# Patient Record
Sex: Male | Born: 1990
Health system: Southern US, Community
[De-identification: ages and names within clinical notes are randomized; demographics above are authoritative.]

---

## 2019-07-01 ENCOUNTER — Ambulatory Visit (HOSPITAL_COMMUNITY)
Admission: EM | Admit: 2019-07-01 | Discharge: 2019-07-01 | Disposition: A | Discharge status: Home / Self Care | Payer: Self-pay | Attending: Emergency Medicine | Admitting: Emergency Medicine

## 2019-07-01 ENCOUNTER — Other Ambulatory Visit: Payer: Self-pay

## 2019-07-01 ENCOUNTER — Encounter (HOSPITAL_COMMUNITY): Payer: Self-pay

## 2019-07-01 DIAGNOSIS — K648 Other hemorrhoids: Secondary | ICD-10-CM

## 2019-07-01 DIAGNOSIS — K644 Residual hemorrhoidal skin tags: Secondary | ICD-10-CM

## 2019-07-01 MED ORDER — LIDOCAINE-HYDROCORTISONE ACE 1-1 % EX CREA: 1.0000 "application " | TOPICAL_CREAM | Freq: Two times a day (BID) | CUTANEOUS | 0 refills | Status: DC

## 2019-07-01 NOTE — ED Provider Notes (Signed)
HPI  SUBJECTIVE:  Richard Hurst is a 28 y.o. male who presents with a rectal mass present for the past 10 days.  He reports pinching, stabbing intermittent pain that lasts up to a day or 2, and then gets better.  He states that he is currently not having any pain unless he touches it.  He comes in today because he saw some blood on his stool.  Denies melena, hematochezia.  No change in bowel habits, vomiting, fevers, body aches, abdominal pain, unintentional weight loss.  He states that he has been having issues with constipation recently.  He reports pain with stooling.  He has not tried anything for his symptoms.  No alleviating factors.  Symptoms are worse with sitting, standing, stooling, paplation.  Past medical history negative for cancer, anemia, hemorrhoids, anticoagulant, antiplatelet use, HIV.  Family history negative for colorectal cancer, IBS.  PMD: None.  All history obtained through language line.  History reviewed. No pertinent past medical history.  History reviewed. No pertinent surgical history.  History reviewed. No pertinent family history.  Social History   Tobacco Use  . Smoking status: Not on file  Substance Use Topics  . Alcohol use: Not on file  . Drug use: Not on file    No current facility-administered medications for this encounter.   Current Outpatient Medications:  .  Lidocaine-Hydrocortisone Ace 1-1 % CREA, Apply 1 application topically 2 (two) times daily., Disp: 30 g, Rfl: 0  No Known Allergies   ROS  As noted in HPI.   Physical Exam  BP 109/75 (BP Location: Right Arm)   Pulse 68   Temp 97.6 F (36.4 C) (Oral)   Resp 18   Wt 92 kg   SpO2 100%   Constitutional: Well developed, well nourished, no acute distress Eyes:  EOMI, conjunctiva normal bilaterally HENT: Normocephalic, atraumatic,mucus membranes moist Respiratory: Normal inspiratory effort Cardiovascular: Normal rate GI: nondistended Rectal: Large skin tag/healing external  hemorrhoid on the right side.  No tenderness, expressible purulent drainage, expressible clot, active bleeding.  Positive mild bleeding from internal hemorrhoids seen on anoscopy.  No prolapse of internal hemorrhoids. skin: No rash, skin intact Musculoskeletal: no deformities Neurologic: Alert & oriented x 3, no focal neuro deficits Psychiatric: Speech and behavior appropriate   ED Course   Medications - No data to display  No orders of the defined types were placed in this encounter.   No results found for this or any previous visit (from the past 24 hour(s)). No results found.  ED Clinical Impression  1. External hemorrhoid   2. Internal hemorrhoids      ED Assessment/Plan  Patient with a healing thrombosed external hemorrhoid/skin tag on the right side.  It does not appear to be a prolapsed internal hemorrhoid.  There is no evidence of infection, active bleeding.  He does have a small amount of bleeding of internal hemorrhoids.  Will send home with hydrocortisone/lidocaine cream  if symptoms become severe, do not use longer than a week, otherwise Preparation H, sitz bath, stool softeners.  Primary care list for routine care.  Follow-up with [place] [facility] for the internal hemorrhoids if they are still giving him problems in 6 to 8 weeks.  Discussed ER return precautions.  Using language line discussed  MDM, treatment plan, and plan for follow-up with patient. Discussed sn/sx that should prompt return to the ED. patient agrees with plan.   Meds ordered this encounter  Medications  . Lidocaine-Hydrocortisone Ace 1-1 % CREA  Sig: Apply 1 application topically 2 (two) times daily.    Dispense:  30 g    Refill:  0    *This clinic note was created using Scientist, clinical (histocompatibility and immunogenetics). Therefore, there may be occasional mistakes despite careful proofreading.   ?    Domenick Gong, MD 07/01/19 [phone]

## 2019-07-01 NOTE — Discharge Instructions (Addendum)
You can apply Preparation H.  You can try the lidocaine hydrocortisone if the symptoms become severe.  Do not apply the lidocaine/hydrocortisone cream for more than 1 week.  Sitz baths.  MiraLAX to help keep your stool soft.  Do not sit or strain on the toilet for prolonged periods of time.  Follow-up with a primary care provider as soon as you can for ongoing care, see list below.  Follow-up with [place] [facility] if you are still having symptoms in 6 to 8 weeks.  Below is a list of primary care practices who are taking new patients for you to follow-up with.  [facility] at [facility] [address] [place], [place] [address] [phone]  [place], [place] [address] [phone]  [facility] [phone] [place] [address]  [facility]: [place]  [phone]  [place] [facility]: [place]   [phone]  [facility]: [address] [place]  [phone]  [place] [facility] : [address] [place] [phone]  [facility]: [address] [place] [phone] [phone]  [facility]: [place] [phone]  Dr. Blanchie Serve [place]  [phone]  Dr. Benito Mccreedy, [facility]. [place], [place] [address]  [phone]  Go to www.goodrx.com to look up your medications. This will give you a list of where you can find your prescriptions at the most affordable prices. Or ask the pharmacist what the cash price is, or if they have  any other discount programs available to help make your medication more affordable. This can be less expensive than what you would pay with insurance.

## 2019-07-01 NOTE — ED Triage Notes (Signed)
Pt states he has rectal pain x 10 days. Pt states he has tried nothing for the pain.

## 2019-09-16 ENCOUNTER — Ambulatory Visit (INDEPENDENT_AMBULATORY_CARE_PROVIDER_SITE_OTHER): Payer: PRIVATE HEALTH INSURANCE

## 2019-09-16 ENCOUNTER — Encounter (HOSPITAL_COMMUNITY): Payer: Self-pay

## 2019-09-16 ENCOUNTER — Ambulatory Visit (HOSPITAL_COMMUNITY)
Admission: EM | Admit: 2019-09-16 | Discharge: 2019-09-16 | Disposition: A | Discharge status: Home / Self Care | Payer: PRIVATE HEALTH INSURANCE | Attending: Family Medicine | Admitting: Family Medicine

## 2019-09-16 ENCOUNTER — Other Ambulatory Visit: Payer: Self-pay

## 2019-09-16 DIAGNOSIS — M542 Cervicalgia: Secondary | ICD-10-CM | POA: Diagnosis not present

## 2019-09-16 DIAGNOSIS — S46812A Strain of other muscles, fascia and tendons at shoulder and upper arm level, left arm, initial encounter: Secondary | ICD-10-CM

## 2019-09-16 DIAGNOSIS — M25512 Pain in left shoulder: Secondary | ICD-10-CM

## 2019-09-16 DIAGNOSIS — M79602 Pain in left arm: Secondary | ICD-10-CM

## 2019-09-16 MED ORDER — KETOROLAC TROMETHAMINE 60 MG/2ML IM SOLN
60.0000 mg | Freq: Once | INTRAMUSCULAR | Status: AC
  Administered 2019-09-16: 11:00:00 60 mg via INTRAMUSCULAR

## 2019-09-16 MED ORDER — CYCLOBENZAPRINE HCL 10 MG PO TABS: 10.0000 mg | ORAL_TABLET | Freq: Two times a day (BID) | ORAL | 0 refills | Status: DC | PRN

## 2019-09-16 MED ORDER — KETOROLAC TROMETHAMINE 60 MG/2ML IM SOLN
INTRAMUSCULAR | Status: AC
  Filled 2019-09-16: qty 2

## 2019-09-16 MED ORDER — IBUPROFEN 800 MG PO TABS: 800.0000 mg | ORAL_TABLET | Freq: Three times a day (TID) | ORAL | 0 refills | Status: DC | PRN

## 2019-09-16 MED FILL — CYCLOBENZAPRINE 10 MG TAB: 10 | 10 days supply | Qty: 20 | Fill #0

## 2019-09-16 MED FILL — IBUPROFEN 800 MG TABLET: 800 | 7 days supply | Qty: 21 | Fill #0

## 2019-09-16 NOTE — Discharge Instructions (Addendum)
You likely have a strain in the muscles of your neck. This could be causing radiating pain down your arm.  Your xray today is negative for any bony abnormalities.   Follow up with orthopedics if symptoms persist. Go to the Emergency Room for sudden loss of sensation, unrelenting pain, or other concerning symptoms.

## 2019-09-16 NOTE — ED Provider Notes (Signed)
[facility]   [id number] 09/16/19 Arrival Time: 0943  CN:OBSJG PAIN  SUBJECTIVE: History from: patient. Richard Hurst is a 29 y.o. male complains of right shoulder and neck pain that began 2 weeks ago.  Denies a precipitating event or specific injury.  Localizes the pain to the R neck and shoulder. Describes the pain as intermittent and achy in character. Has tried OTC medications without relief.  Symptoms are made worse with activity. Denies similar symptoms in the past. Denies fever, chills, erythema, ecchymosis, effusion, weakness, numbness and tingling, saddle paresthesias, loss of bowel or bladder function.      ROS: As per HPI.  All other pertinent ROS negative.     History reviewed. No pertinent past medical history. History reviewed. No pertinent surgical history. No Known Allergies No current facility-administered medications on file prior to encounter.   Current Outpatient Medications on File Prior to Encounter  Medication Sig Dispense Refill  . Lidocaine-Hydrocortisone Ace 1-1 % CREA Apply 1 application topically 2 (two) times daily. 30 g 0   Social History   Socioeconomic History  . Marital status: Single    Spouse name: Not on file  . Number of children: Not on file  . Years of education: Not on file  . Highest education level: Not on file  Occupational History  . Not on file  Tobacco Use  . Smoking status: Never Smoker  Substance and Sexual Activity  . Alcohol use: Not on file  . Drug use: Not on file  . Sexual activity: Not on file  Other Topics Concern  . Not on file  Social History Narrative  . Not on file   Social Determinants of Health   Financial Resource Strain:   . Difficulty of Paying Living Expenses: Not on file  Food Insecurity:   . Worried About Charity fundraiser in the Last Year: Not on file  . Ran Out of Food in the Last Year: Not on file  Transportation Needs:   . Lack of Transportation (Medical): Not on file  . Lack of  Transportation (Non-Medical): Not on file  Physical Activity:   . Days of Exercise per Week: Not on file  . Minutes of Exercise per Session: Not on file  Stress:   . Feeling of Stress : Not on file  Social Connections:   . Frequency of Communication with Friends and Family: Not on file  . Frequency of Social Gatherings with Friends and Family: Not on file  . Attends Religious Services: Not on file  . Active Member of Clubs or Organizations: Not on file  . Attends Archivist Meetings: Not on file  . Marital Status: Not on file  Intimate Partner Violence:   . Fear of Current or Ex-Partner: Not on file  . Emotionally Abused: Not on file  . Physically Abused: Not on file  . Sexually Abused: Not on file   Family History  Family history unknown: Yes    OBJECTIVE:  Vitals:   09/16/19 1006  BP: 112/73  Pulse: 84  Resp: 18  Temp: 98.6 F (37 C)  TempSrc: Oral  SpO2: 98%    General appearance: ALERT; in no acute distress.  Head: NCAT Lungs: Normal respiratory effort CV: radial pulses 2+ bilaterally. Cap refill < 2 seconds Musculoskeletal:  Inspection: Skin warm, dry, clear and intact without obvious erythema, effusion, or ecchymosis.  Palpation: L Trapezius tenderness to palpation ROM: FROM active and passive Strength: 5/5 shld abduction, 5/5 shld adduction, 5/5  elbow flexion, 5/5 elbow extension, 5/5 grip strength, 5/5 hip flexion, 5/5 knee abduction, 5/5 knee adduction, 5/5 knee flexion, 5/5 knee extension, 5/5 dorsiflexion, 5/5 plantar flexion Stability: Anterior/ posterior drawer intact Skin: warm and dry Neurologic: Ambulates without difficulty; Sensation intact about the upper/ lower extremities Psychological: alert and cooperative; normal mood and affect  DIAGNOSTIC STUDIES:  DG Shoulder Left  Result Date: 09/16/2019 CLINICAL DATA:  Left shoulder for 2 weeks, no known injury, initial encounter EXAM: LEFT SHOULDER - 2+ VIEW COMPARISON:  None. FINDINGS: There  is no evidence of fracture or dislocation. There is no evidence of arthropathy or other focal bone abnormality. Soft tissues are unremarkable. IMPRESSION: No acute abnormality noted. Electronically Signed   By: Alcide Clever M.D.   On: 09/16/2019 10:46     ASSESSMENT & PLAN:  1. Trapezius strain, left, initial encounter   2. Neck pain   3. Left arm pain   4. Acute pain of left shoulder      Meds ordered this encounter  Medications  . ibuprofen (ADVIL) 800 MG tablet    Sig: Take 1 tablet (800 mg total) by mouth every 8 (eight) hours as needed for moderate pain.    Dispense:  21 tablet    Refill:  0    Order Specific Question:   Supervising Provider    Answer:   Merrilee Jansky [id number]  . cyclobenzaprine (FLEXERIL) 10 MG tablet    Sig: Take 1 tablet (10 mg total) by mouth 2 (two) times daily as needed for muscle spasms.    Dispense:  20 tablet    Refill:  0    Order Specific Question:   Supervising Provider    Answer:   Merrilee Jansky [id number]  . ketorolac (TORADOL) injection 60 mg    Continue conservative management of rest, ice, and gentle stretches Take ibuprofen as needed for pain relief (may cause abdominal discomfort, ulcers, and GI bleeds avoid taking with other NSAIDs) Take cyclobenzaprine at nighttime for symptomatic relief. Avoid driving or operating heavy machinery while using medication. Follow up with PCP if symptoms persist Return or go to the ER if you have any new or worsening symptoms (fever, chills, chest pain, abdominal pain, changes in bowel or bladder habits, pain radiating into lower legs, etc...)   [place] Controlled Substances Registry consulted for this patient. I feel the risk/benefit ratio today is favorable for proceeding with this prescription for a controlled substance. Medication sedation precautions given.  Reviewed expectations re: course of current medical issues. Questions answered. Outlined signs and symptoms indicating need for more acute  intervention. Patient verbalized understanding. After Visit Summary given.       Moshe Cipro, NP 09/16/19 1054

## 2019-09-16 NOTE — ED Triage Notes (Signed)
Pt presents with left side neck pain that radiates down into left arm X 2 weeks.

## 2019-11-02 ENCOUNTER — Ambulatory Visit (HOSPITAL_COMMUNITY)
Admission: EM | Admit: 2019-11-02 | Discharge: 2019-11-02 | Disposition: A | Discharge status: Home / Self Care | Payer: PRIVATE HEALTH INSURANCE | Attending: Physician Assistant | Admitting: Physician Assistant

## 2019-11-02 ENCOUNTER — Encounter (HOSPITAL_COMMUNITY): Payer: Self-pay

## 2019-11-02 ENCOUNTER — Other Ambulatory Visit: Payer: Self-pay

## 2019-11-02 DIAGNOSIS — M25512 Pain in left shoulder: Secondary | ICD-10-CM | POA: Diagnosis not present

## 2019-11-02 MED ORDER — IBUPROFEN 800 MG PO TABS: 800.0000 mg | ORAL_TABLET | Freq: Three times a day (TID) | ORAL | 0 refills | Status: DC | PRN

## 2019-11-02 MED ORDER — KETOROLAC TROMETHAMINE 60 MG/2ML IM SOLN
60.0000 mg | Freq: Once | INTRAMUSCULAR | Status: AC
  Administered 2019-11-02: 60 mg via INTRAMUSCULAR

## 2019-11-02 MED ORDER — KETOROLAC TROMETHAMINE 60 MG/2ML IM SOLN
INTRAMUSCULAR | Status: AC
  Filled 2019-11-02: qty 2

## 2019-11-02 MED FILL — IBUPROFEN 800 MG TABLET: 800 | 7 days supply | Qty: 21 | Fill #0

## 2019-11-02 NOTE — Discharge Instructions (Signed)
Take the ibuprofen every 8 hours. Wear the sling for the next 2 to 3 days. Call the sports medicine group to have evaluation in the next 1 to 2 weeks.  I have given you a note to have duty such that you do not lift anything over 20 pounds over your head for 1 week.  If you are unable to be seen by sports medicine and continue to have severe pain despite treatment in 3 to 4 days please return for reevaluation.

## 2019-11-02 NOTE — ED Provider Notes (Signed)
[facility]    CSN: [id number] Arrival date & time: 11/02/19  1053      History   Chief Complaint Chief Complaint  Patient presents with  . Shoulder Pain    HPI Richard Hurst is a 29 y.o. male.   Patient reports urgent care for evaluation of left shoulder pain.  He reports his left shoulder has been giving him issues over the last 1 week.  Reports pain in the shoulder joint when the arm is hanging down.  He reports pain also with movement.  He reports the pain is sharp at times and achy at others.  Pain is located generally all over the shoulder patient does point to the front side and back of the shoulder.  He reports a history in February of having similar shoulder and trap and neck pain.  However this is different because this is primarily in the shoulder.  He denies injuring the shoulder directly.  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  He denies any numbness, tingling or weakness in the upper extremities.  Denies history of shoulder injury or surgeries.  Patient was seen in this urgent care on 09/16/2019 diagnosed with trapezius strain placed on ibuprofen and Flexeril.  Reports that the trapezius area pain is resolved however the shoulder pain is persisted.  Patient is requesting to be written out of work for 2 weeks.     History reviewed. No pertinent past medical history.  There are no problems to display for this patient.   History reviewed. No pertinent surgical history.     Home Medications    Prior to Admission medications   Medication Sig Start Date End Date Taking? Authorizing Provider  cyclobenzaprine (FLEXERIL) 10 MG tablet Take 1 tablet (10 mg total) by mouth 2 (two) times daily as needed for muscle spasms. 09/16/19   Moshe Cipro, NP  ibuprofen (ADVIL) 800 MG tablet Take 1 tablet (800 mg total) by mouth every 8 (eight) hours as needed for moderate pain. 11/02/19   Guillermo Difrancesco, Veryl Speak, PA-C  Lidocaine-Hydrocortisone Ace 1-1 %  CREA Apply 1 application topically 2 (two) times daily. 07/01/19   Domenick Gong, MD    Family History Family History  Family history unknown: Yes    Social History Social History   Tobacco Use  . Smoking status: Never Smoker  . Smokeless tobacco: Never Used  Substance Use Topics  . Alcohol use: Not on file  . Drug use: Not on file     Allergies   Patient has no known allergies.   Review of Systems Review of Systems  Constitutional: Negative for chills and fever.  Musculoskeletal:       See HPI  Skin: Negative for color change and rash.  Neurological: Negative for headaches.     Physical Exam Triage Vital Signs ED Triage Vitals  Enc Vitals Group     BP 11/02/19 1147 (!) 131/99     Pulse Rate 11/02/19 1147 86     Resp 11/02/19 1147 18     Temp 11/02/19 1147 98.2 F (36.8 C)     Temp Source 11/02/19 1147 Oral     SpO2 11/02/19 1147 100 %     Weight --      Height --      Head Circumference --      Peak Flow --      Pain Score 11/02/19 1148 10     Pain Loc --  Pain Edu? --      Excl. in GC? --    No data found.  Updated Vital Signs BP (!) 131/99 (BP Location: Right Arm)   Pulse 86   Temp 98.2 F (36.8 C) (Oral)   Resp 18   SpO2 100%   Visual Acuity Right Eye Distance:   Left Eye Distance:   Bilateral Distance:    Right Eye Near:   Left Eye Near:    Bilateral Near:     Physical Exam Vitals and nursing note reviewed.  Constitutional:      Appearance: He is well-developed.     Comments: Patient holds shoulder frequently in pain while at rest with increased animation of distress.  However this is inconsistent with amount of pain elicited through range of motion exam and palpation.  HENT:     Head: Normocephalic and atraumatic.  Eyes:     Conjunctiva/sclera: Conjunctivae normal.  Cardiovascular:     Rate and Rhythm: Normal rate and regular rhythm.     Heart sounds: No murmur.  Pulmonary:     Effort: Pulmonary effort is normal. No  respiratory distress.     Breath sounds: Normal breath sounds.  Abdominal:     Palpations: Abdomen is soft.     Tenderness: There is no abdominal tenderness.  Musculoskeletal:     Cervical back: Neck supple. No rigidity or tenderness.     Comments: No shoulder deformity ecchymosis or swelling.   Patient has full range of motion at the shoulder able to fully circumduct and raise overhead.  There is some reported pain with this range of motion however it is not limited his range of motion.  There is some tenderness to palpation of the posterior aspect of the shoulder.  Some pain elicited with empty can testing.  Pain elicited with internal and external rotation to resistance.  AC joint without step-off and no sag on distal traction of the shoulder.  Strength 5/5 and equal with resisted abduction and adduction of the shoulder, 5/5 strength at the elbow joint in all directions and grip strength.  Skin:    General: Skin is warm and dry.     Capillary Refill: Capillary refill takes less than 2 seconds.  Neurological:     General: No focal deficit present.     Mental Status: He is alert and oriented to person, place, and time.     Comments: Sensation equal bilaterally in upper extremities      UC Treatments / Results  Labs (all labs ordered are listed, but only abnormal results are displayed) Labs Reviewed - No data to display  EKG   Radiology No results found.  Procedures Procedures (including critical care time)  Medications Ordered in UC Medications  ketorolac (TORADOL) injection 60 mg (60 mg Intramuscular Given 11/02/19 1242)    Initial Impression / Assessment and Plan / UC Course  I have reviewed the triage vital signs and the nursing notes.  Pertinent labs & imaging results that were available during my care of the patient were reviewed by me and considered in my medical decision making (see chart for details).     #Shoulder pain Patient is a 29 year old presenting with  acute left shoulder pain.  Appears that his trapezius muscle strain is resolved however he has pain specific to the shoulder joint this time.  Differential would include rotator cuff tendinopathy vs biceps tendinitis vs labral injury vs  deltoid strain.  Given patient has increased pain with arm hanging will  place in a sling for comfort.  Discussed Toradol injection patient agrees to this.  Discussed the patient should follow-up with sports medicine as he has continued shoulder pain.  Discussed that I cannot write the patient out for 2 weeks as we have a policy of only giving 3 days.  Will give 3-day work note and instructed follow-up with sports medicine.  Patient verbalizes he will do this.  Final Clinical Impressions(s) / UC Diagnoses   Final diagnoses:  Acute pain of left shoulder     Discharge Instructions     Take the ibuprofen every 8 hours. Wear the sling for the next 2 to 3 days. Call the sports medicine group to have evaluation in the next 1 to 2 weeks.  I have given you a note to have duty such that you do not lift anything over 20 pounds over your head for 1 week.  If you are unable to be seen by sports medicine and continue to have severe pain despite treatment in 3 to 4 days please return for reevaluation.      ED Prescriptions    Medication Sig Dispense Auth. Provider   ibuprofen (ADVIL) 800 MG tablet Take 1 tablet (800 mg total) by mouth every 8 (eight) hours as needed for moderate pain. 21 tablet Jeanett Antonopoulos, Marguerita Beards, PA-C     PDMP not reviewed this encounter.   Purnell Shoemaker, PA-C 11/03/19 [phone]

## 2019-11-02 NOTE — ED Triage Notes (Signed)
Pt states he has left shoulder pain. X 1 week pt states that when he holds his arm down the pain is a 10.

## 2019-11-05 ENCOUNTER — Other Ambulatory Visit: Payer: Self-pay

## 2019-11-05 ENCOUNTER — Ambulatory Visit (INDEPENDENT_AMBULATORY_CARE_PROVIDER_SITE_OTHER): Payer: PRIVATE HEALTH INSURANCE | Admitting: Pediatrics

## 2019-11-05 VITALS — BP 108/72 | Ht 75.0 in

## 2019-11-05 DIAGNOSIS — M542 Cervicalgia: Secondary | ICD-10-CM

## 2019-11-05 MED ORDER — PREDNISONE 10 MG (21) PO TBPK: ORAL_TABLET | Freq: Every day | ORAL | 0 refills | Status: AC

## 2019-11-05 MED ORDER — CYCLOBENZAPRINE HCL 10 MG PO TABS: 10.0000 mg | ORAL_TABLET | Freq: Two times a day (BID) | ORAL | 0 refills | Status: AC | PRN

## 2019-11-05 MED ORDER — PREDNISONE 10 MG PO TABS: ORAL_TABLET | ORAL | 0 refills | Status: DC

## 2019-11-05 MED FILL — predniSONE 10 MG TABS: 10 | 7 days supply | Qty: 21 | Fill #0

## 2019-11-05 MED FILL — CYCLOBENZAPRINE 10 MG TAB: 10 | 20 days supply | Qty: 40 | Fill #0

## 2019-11-05 NOTE — Progress Notes (Signed)
Richard Hurst - 29 y.o. male MRN [id number]  Date of birth: May 17, 1991  SUBJECTIVE:   CC: Left shoulder pain   29 year old gentleman presenting for left shoulder pain for the past 2 weeks.  He reports that he woke up with shoulder pain. He has pain throughout shoulder as well as some pain extending into his trapezius area. Pain is better when lying down and worse when sitting or standing. He also reports numbness and tingling down his arm extending into his hand. He denies any specific injury or inciting event.  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  He presented to the ED 3 days ago and was provided a Toradol injection, a prescription for Flexeril, and lidocaine ointment.  They also gave him a sling for his arm as he had more comfort with arm in sling than when hanging by his side.  He reports that the Flexeril helps some.   No weakness. Has not changed over the course of 2 weeks. He reports that he had similar pain 2 months ago in his neck and left shoulder.  ROS:  see HPI   PMHx - Updated and reviewed.  Contributory factors include: Negative PSHx - Updated and reviewed.  Contributory factors include:  Negative FHx - Updated and reviewed.  Contributory factors include:  Negative Social Hx - Updated and reviewed. Contributory factors include: Negative Medications - reviewed   DATA REVIEWED: ED records   PHYSICAL EXAM:  VS: BP:108/72  HR: bpm  TEMP: ( )  RESP:   HT:6\' 3"  (190.5 cm)   WT:   BMI:  PHYSICAL EXAM: Gen: NAD, alert, appears in pain HEENT: clear conjunctiva,  CV:  no edema, capillary refill brisk, normal rate Resp: non-labored Skin: no rashes, normal turgor  Neuro: no gross deficits.  Psych:  alert and oriented  Left Shoulder: Inspection reveals no obvious deformity, atrophy, or asymmetry. No bruising. No swelling General pain throughout shoulder- does not change with palpation Full ROM in flexion, abduction,  internal/external rotation NV intact distally Normal scapular function observed. Special Tests:  - Impingement: Neg Hawkins, neers, empty can sign. - Supraspinatous: Negative empty can.  5/5 strength with resisted flexion at 20 degrees - Infraspinatous/Teres Minor: 5/5 strength with ER - Subscapularis: 5/5 strength with IR - Biceps tendon: Negative Speeds, Yerrgason's  - No painful arc   Neck/Back: - Inspection: no gross deformity or asymmetry, swelling or ecchymosis - Palpation: no TTP over spinous process, TTP with trigger point in left lateral trapezius muscle - ROM: full active ROM of the cervical spine with neck extension, rotation, flexion - pain in all directions - Strength: 5/5 wrist flexion, extension, biceps flexion. 5/5 triceps extension. OK sign, interosseus strength intact  - Neuro: sensation intact in the C5-C8 nerve root distribution b/l, 2+ C5-C7 reflexes - Special testing: positive spurling's    ASSESSMENT & PLAN:  29 year old male presenting with left shoulder pain as well as radicular symptoms for the past 2 weeks. Suspect that he has impingement of C5 nerve root from a possible disc protrusion that is causing pain in shoulder as well as radicular symptoms. Spurling's maneuver reproduced his symptoms, but no weakness noted on exam and normal reflexes.  Will treat with a steroid Dosepak to see if this relieves his symptoms as well as referral to physical therapy.  Also refilled Flexeril. Return in 6 weeks.  I was the preceptor for this visit and available for immediate consultation Marsa Aris,  DO

## 2019-11-12 ENCOUNTER — Encounter: Payer: Self-pay | Admitting: Physical Therapy

## 2019-11-12 ENCOUNTER — Other Ambulatory Visit: Payer: Self-pay

## 2019-11-12 ENCOUNTER — Ambulatory Visit: Payer: PRIVATE HEALTH INSURANCE | Attending: Sports Medicine | Admitting: Physical Therapy

## 2019-11-12 DIAGNOSIS — M25512 Pain in left shoulder: Secondary | ICD-10-CM | POA: Diagnosis present

## 2019-11-12 DIAGNOSIS — M62838 Other muscle spasm: Secondary | ICD-10-CM | POA: Insufficient documentation

## 2019-11-12 DIAGNOSIS — G8929 Other chronic pain: Secondary | ICD-10-CM | POA: Insufficient documentation

## 2019-11-12 NOTE — Therapy (Signed)
[facility] Outpatient Rehabilitation [facility] [address] [place], [place], [address] Phone: [phone]   Fax:  [phone]  Physical Therapy Evaluation  Patient Details  Name: Richard Hurst MRN: [id number] Date of Birth: [birth year] Referring Provider (PT): Dr Reino Bellis    Encounter Date: 11/12/2019  PT End of Session - 11/12/19 1727    Visit Number  1    Number of Visits  6    Date for PT Re-Evaluation  12/24/19    Authorization Type  [employer]    PT Start Time  1545    PT Stop Time  1626    PT Time Calculation (min)  41 min    Activity Tolerance  Patient tolerated treatment well    Behavior During Therapy  [facility] for tasks assessed/performed       History reviewed. No pertinent past medical history.  History reviewed. No pertinent surgical history.  There were no vitals filed for this visit.   Subjective Assessment - 11/12/19 1550    Subjective  Patient began having left shoulder pain about 3 weeks ago. He had no MOI. he lifts boxes at work but can not rememeber doing anything to the shoulder. He is currently in a sling. If he hangs his arm down the pain is worse. Whe pain is in the front of his shoulder and he has some tightness in his neck. he reports that pain can go down into his hand.    Patient is accompained by:  Interpreter   strtus Efrain Sella [id number]   Limitations  Lifting    Diagnostic tests  X-ray: (-) for acute abnormality    Currently in Pain?  Yes    Pain Score  7     Pain Location  Arm    Pain Orientation  Left    Pain Descriptors / Indicators  Aching    Pain Type  Chronic pain    Pain Radiating Towards  into his hand    Pain Onset  More than a month ago    Pain Frequency  Constant    Aggravating Factors   letting his arm hang down; moving the shoulder forward and back    Pain Relieving Factors  has a cream he puts on it    Effect of Pain on Daily Activities  difficulty using his arm         [facility] PT Assessment - 11/12/19 0001       Assessment   Medical Diagnosis  Left Shoulder Pain     Referring Provider (PT)  Dr Reino Bellis     Onset Date/Surgical Date  --   3 weeks prior    Hand Dominance  Right    Next MD Visit  Nothing scheduled at this time     Prior Therapy  None       Precautions   Precautions  None      Restrictions   Weight Bearing Restrictions  No      Balance Screen   Has the patient fallen in the past 6 months  No    Has the patient had a decrease in activity level because of a fear of falling?   No    Is the patient reluctant to leave their home because of a fear of falling?   No      Prior Function   Level of Independence  Independent    Vocation  Full time employment    Vocation Requirements  works moving boxes  Leisure  Patient plays soccer       Cognition   Overall Cognitive Status  Within Functional Limits for tasks assessed    Attention  Focused      Observation/Other Assessments   Observations  shows non-verbal signs of pain. The patient is costnatly shifting in his chair and rubbing his shoulder     Focus on Therapeutic Outcomes (FOTO)   not given       Sensation   Light Touch  Appears Intact      Coordination   Gross Motor Movements are Fluid and Coordinated  Yes    Fine Motor Movements are Fluid and Coordinated  Yes      ROM / Strength   AROM / PROM / Strength  AROM;PROM;Strength      AROM   Overall AROM Comments  full active flexion, internal rotation and extenral rotation but reports pain with each; full active cervical motion with pain in the shoulder with end range flexion       Strength   Overall Strength Comments  right shoulder 5/5     Strength Assessment Site  Shoulder;Hand    Right/Left Shoulder  Right;Left    Left Shoulder Flexion  4+/5    Left Shoulder Internal Rotation  4+/5    Left Shoulder External Rotation  4+/5    Right/Left hand  Right;Left    Right Hand Grip (lbs)  60    Left Hand Grip (lbs)  30      Palpation   Palpation comment   signifcant tenderness to palpation in the posterior shoulder and anterior shouder  Significant tightness in the neck but reports no tenderness to palpation                 Objective measurements completed on examination: See above findings.      [facility] Adult PT Treatment/Exercise - 11/12/19 0001      Shoulder Exercises: Supine   Other Supine Exercises  shoulder extension yellow 2x10; scap retraction yellow 2x10 ;       Manual Therapy   Manual therapy comments  attempted grade 1 ishoulder inferior glides and APglides but patient showed signs of increased pain; attempted upper trap trigger point release yto upper trap and light traction       Neck Exercises: Stretches   Upper Trapezius Stretch Limitations  2x20 sec hold     Levator Stretch Limitations  2x20 sec hold              PT Education - 11/12/19 1604    Education Details  improitance of posture and moving; symptom mangement    Person(s) Educated  Patient    Methods  Explanation;Demonstration;Verbal cues;Tactile cues;Handout    Comprehension  Verbalized understanding;Returned demonstration;Verbal cues required;Tactile cues required       PT Short Term Goals - 11/12/19 1659      PT SHORT TERM GOAL #1   Title  Patient will report no pain with end range cervical extension    Time  3    Period  Weeks    Status  New    Target Date  12/03/19      PT SHORT TERM GOAL #2   Title  Patient will report no radicular pain into right arm    Time  3    Period  Weeks    Status  New    Target Date  12/03/19      PT SHORT TERM GOAL #3   Title  Patient  will demonstrate 5/5 gross left shoulder strength    Time  3    Period  Weeks    Status  New    Target Date  12/03/19        PT Long Term Goals - 11/12/19 1705      PT LONG TERM GOAL #1   Title  Patient will keep arm out of sling for 1 day with no pain    Time  6    Period  Weeks    Status  New    Target Date  12/24/19      PT LONG TERM GOAL #2   Title   Patient will use arm for all adl's without increased pain    Time  6    Period  Weeks    Status  New    Target Date  12/24/19      PT LONG TERM GOAL #3   Title  Patient will perfrom work tasks without pain    Time  6    Period  Weeks    Status  New    Target Date  12/24/19             Plan - 11/12/19 1645    Clinical Impression Statement  Patient is a 29 year old male with right shoulder and neck pain. He shows severe non-verbal signs of pain during the treatment. Despite high levels of pain the patient had 4+/5 shoulder flexion and ER strength, full active shoulder motion, and full cervical motion in all palnes. He did report increased pain with cervical extension. He has decreased grip strength on the left compared to right. He reported no tenderness to palpation of his upper trap and cervical spine. He had high levels of pain in hs posterior and anterior shoulder with palpation. He did not respond well to light soft tissue mobilization or shoulder joint low grade mobilizations meant for pain releif. He has tightness in bilateral upper traps but he has tightness bilateral.  He was given light stretching for home and light posterior chain strengthening for posture and to decrease pressure on the anterior shoulder. We will see next visit if we can get him to respond better to interventions to decrease pain and spasming and otherwise try to progress postrural correction and self stretching exercises. Patient asked when he could go abck to work . he was advised that the MD will make that determination based on his progress.    Personal Factors and Comorbidities  Profession    Examination-Activity Limitations  Carry;Lift;Reach Overhead    Examination-Participation Restrictions  Community Activity;Driving    Stability/Clinical Decision Making  Evolving/Moderate complexity   increasing pain in left shoulder   Clinical Decision Making  High    Rehab Potential  Fair   high levels of pain with  minimal strength and motion restrictions   PT Frequency  1x / week    PT Duration  6 weeks    PT Treatment/Interventions  ADLs/Self Care Home Management;Electrical Stimulation;Cryotherapy;Iontophoresis 4mg /ml Dexamethasone;Ultrasound;DME Instruction;Functional mobility training;Therapeutic exercise;Neuromuscular re-education;Patient/family education;Manual techniques;Passive range of motion;Dry needling;Taping    PT Next Visit Plan  patient did not respond well to low grade shoulder mobilizations, STM to upper trap or light cervical traction, we can try again maybe without the examation protion he will have a better respose. He was able to tolerate light strethcing and posterior chain stretching. Consider modalities if beneficial. If patient improves we can schedule out more visits. Consdier wand ther-ex; consider closed chain  shoulder strengthening    PT Home Exercise Plan  upper trap stretch, levator stretch, scpa retraction yellow; shoulder extneison yellow    Consulted and Agree with Plan of Care  Patient       Patient will benefit from skilled therapeutic intervention in order to improve the following deficits and impairments:  Impaired UE functional use, Postural dysfunction, Pain, Decreased strength, Decreased mobility, Decreased activity tolerance, Decreased endurance, Increased muscle spasms  Visit Diagnosis: Chronic left shoulder pain  Other muscle spasm     Problem List There are no problems to display for this patient.   Dessie Coma PT DPT  11/12/2019, 5:28 PM  [facility] [address] [place], [place], [address] Phone: [phone]   Fax:  [phone]  Name: Eland Lamantia MRN: [id number] Date of Birth: Dec 16, 1990

## 2019-11-16 ENCOUNTER — Encounter: Payer: Self-pay | Admitting: Physical Therapy

## 2019-11-16 ENCOUNTER — Ambulatory Visit: Payer: PRIVATE HEALTH INSURANCE

## 2019-11-16 ENCOUNTER — Encounter: Payer: Self-pay | Admitting: *Deleted

## 2019-11-16 ENCOUNTER — Other Ambulatory Visit: Payer: Self-pay

## 2019-11-16 ENCOUNTER — Ambulatory Visit: Payer: PRIVATE HEALTH INSURANCE | Admitting: Physical Therapy

## 2019-11-16 DIAGNOSIS — M25512 Pain in left shoulder: Secondary | ICD-10-CM

## 2019-11-16 DIAGNOSIS — G8929 Other chronic pain: Secondary | ICD-10-CM

## 2019-11-16 DIAGNOSIS — M62838 Other muscle spasm: Secondary | ICD-10-CM

## 2019-11-16 NOTE — Patient Instructions (Signed)
Access Code: [facility] URL: [email or web address] Date: 11/16/2019 Prepared by: Rosana Hoes  Exercises Banded Row - 1 x daily - 7 x weekly - 2 sets - 20 reps Shoulder Extension with Band - 1 x daily - 7 x weekly - 2 sets - 20 reps Shoulder External Rotation with Anchored Resistance - 1 x daily - 7 x weekly - 2 sets - 20 reps Shoulder Internal Rotation with Resistance - 1 x daily - 7 x weekly - 2 sets - 20 reps Scaption with Dumbbells - 1 x daily - 7 x weekly - 2 sets - 20 reps Doorway Pec Stretch at 90 Degrees Abduction - 2 x daily - 7 x weekly - 3 reps - 20 seconds hold Seated Cervical Sidebending Stretch - 2 x daily - 7 x weekly - 2 reps - 20 seconds hold Seated Levator Scapulae Stretch - 2 x daily - 7 x weekly - 2 reps - 20 seconds hold

## 2019-11-16 NOTE — Therapy (Signed)
[facility] Outpatient Rehabilitation [facility] [address] [place], [place], [address] Phone: [phone]   Fax:  [phone]  Physical Therapy Treatment  Patient Details  Name: Richard Hurst MRN: [id number] Date of Birth: 02-09-1991 Referring Provider (PT): Dr Reino Bellis    Encounter Date: 11/16/2019  PT End of Session - 11/16/19 1626    Visit Number  2    Number of Visits  6    Date for PT Re-Evaluation  12/24/19    Authorization Type  [employer]    PT Start Time  1617    PT Stop Time  1700    PT Time Calculation (min)  43 min    Activity Tolerance  Patient limited by pain    Behavior During Therapy  [facility] for tasks assessed/performed       History reviewed. No pertinent past medical history.  History reviewed. No pertinent surgical history.  There were no vitals filed for this visit.  Subjective Assessment - 11/16/19 1624    Subjective  Patient reports he is feeling a litte bit better. He is doing the exercises. 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    Patient is accompained by:  Interpreter   audio   Patient Stated Goals  Get shoulder better    Currently in Pain?  Yes    Pain Score  5     Pain Location  Shoulder    Pain Orientation  Left    Pain Descriptors / Indicators  Aching    Pain Type  Chronic pain    Pain Onset  More than a month ago    Pain Frequency  Constant         [facility] PT Assessment - 11/16/19 0001      AROM   Overall AROM Comments  Full AROM without report of increased pain, he does exhibit hyperflexibility                   [facility] Adult PT Treatment/Exercise - 11/16/19 0001      Shoulder Exercises: Standing   External Rotation  20 reps   2 sets, towel under arm   Theraband Level (Shoulder External Rotation)  Level 4 (Blue)    Internal Rotation  20 reps   2 sets, towel under arm   Theraband Level (Shoulder Internal Rotation)  Level 4 (Blue)    Flexion  20 reps   2  sets, scaption to shoulder height   Shoulder Flexion Weight (lbs)  4    ABduction  20 reps   2 sets, palm down to shoulder height   Shoulder ABduction Weight (lbs)  4    Extension  20 reps   2 sets   Theraband Level (Shoulder Extension)  Level 4 (Blue)    Row  20 reps   2 sets   Theraband Level (Shoulder Row)  Level 4 (Blue)    Other Standing Exercises  Forward ball roll on table for o verhead stretch 5 sec hold x10      Shoulder Exercises: ROM/Strengthening   UBE (Upper Arm Bike)  L4 x 4 min ( 2 fwd/bwd)      Shoulder Exercises: Stretch   Other Shoulder Stretches  Doorway pec stretch with arms at 90 deg abd  3x20 seconds      Neck Exercises: Stretches   Upper Trapezius Stretch  2 reps;20 seconds    Levator Stretch  2 reps;20 seconds  PT Education - 11/16/19 1625    Education Details  HEP, postural control    Person(s) Educated  Patient    Methods  Explanation;Demonstration;Verbal cues;Handout;Tactile cues    Comprehension  Verbalized understanding;Returned demonstration;Verbal cues required;Tactile cues required;Need further instruction       PT Short Term Goals - 11/12/19 1659      PT SHORT TERM GOAL #1   Title  Patient will report no pain with end range cervical extension    Time  3    Period  Weeks    Status  New    Target Date  12/03/19      PT SHORT TERM GOAL #2   Title  Patient will report no radicular pain into right arm    Time  3    Period  Weeks    Status  New    Target Date  12/03/19      PT SHORT TERM GOAL #3   Title  Patient will demonstrate 5/5 gross left shoulder strength    Time  3    Period  Weeks    Status  New    Target Date  12/03/19        PT Long Term Goals - 11/12/19 1705      PT LONG TERM GOAL #1   Title  Patient will keep arm out of sling for 1 day with no pain    Time  6    Period  Weeks    Status  New    Target Date  12/24/19      PT LONG TERM GOAL #2   Title  Patient will use arm for all adl's without  increased pain    Time  6    Period  Weeks    Status  New    Target Date  12/24/19      PT LONG TERM GOAL #3   Title  Patient will perfrom work tasks without pain    Time  6    Period  Weeks    Status  New    Target Date  12/24/19            Plan - 11/16/19 1626    Clinical Impression Statement  Patient tolerated therapy well with no adverse effects. His strengthening exercises were progressed this visit without any report of increased pain and no non-verbal signs that he is experiencing pain with exercise. Manual therapy was deferred as patient demonstrated high levels of discomfort with palpation during evaluation and he exhibits good range of motion. His HEP was progressed this visit and he was encouraged to work on posture. Patient would benefit from continued skilled PT to progress strength and activity tolerance so he can work with reduced pain and limitation.    PT Treatment/Interventions  ADLs/Self Care Home Management;Electrical Stimulation;Cryotherapy;Iontophoresis 4mg /ml Dexamethasone;Ultrasound;DME Instruction;Functional mobility training;Therapeutic exercise;Neuromuscular re-education;Patient/family education;Manual techniques;Passive range of motion;Dry needling;Taping    PT Next Visit Plan  Assess response to HEP and progress PRN, progress periscapular and rotator cuff strength/endurance, consider including rhythmic stabilization as patient exhibits hypermobile tendancies    PT Home Exercise Plan  [facility]: row, extension, ER/IR with blue band; scaption with 3-5 lbs; doorway pec stretch, upper trap stretch, levator scap stretch    Consulted and Agree with Plan of Care  Patient       Patient will benefit from skilled therapeutic intervention in order to improve the following deficits and impairments:  Impaired UE functional use, Postural dysfunction, Pain, Decreased strength,  Decreased mobility, Decreased activity tolerance, Decreased endurance, Increased muscle  spasms  Visit Diagnosis: Chronic left shoulder pain  Other muscle spasm     Problem List There are no problems to display for this patient.   Hilda Blades, PT, DPT, LAT, ATC 11/16/19  5:16 PM Phone: [phone] Fax: [place] [facility] [address] [place], [place], [address] Phone: [phone]   Fax:  [phone]  Name: Richard Hurst MRN: [id number] Date of Birth: Nov 19, 1990

## 2019-11-26 ENCOUNTER — Other Ambulatory Visit: Payer: Self-pay

## 2019-11-26 ENCOUNTER — Ambulatory Visit: Payer: PRIVATE HEALTH INSURANCE

## 2019-11-26 DIAGNOSIS — M25512 Pain in left shoulder: Secondary | ICD-10-CM | POA: Diagnosis not present

## 2019-11-26 DIAGNOSIS — M62838 Other muscle spasm: Secondary | ICD-10-CM

## 2019-11-26 NOTE — Therapy (Addendum)
[place], [place], [address] Phone: [phone]   Fax:  [phone]  Physical Therapy Treatment  Patient Details  Name: Richard Hurst MRN: [id number] Date of Birth: 1990-11-03 Referring Provider (PT): Dr Lilia Argue    Encounter Date: 11/26/2019  PT End of Session - 11/26/19 1339    Visit Number  3    Number of Visits  6    Date for PT Re-Evaluation  12/24/19    Authorization Type  [employer]    PT Start Time  0145    PT Stop Time  0225    PT Time Calculation (min)  40 min    Activity Tolerance  Patient limited by pain    Behavior During Therapy  [facility] for tasks assessed/performed       History reviewed. No pertinent past medical history.  History reviewed. No pertinent surgical history.  There were no vitals filed for this visit.  Subjective Assessment - 11/26/19 1348    Subjective  He reports some whT BETTER    Pain Score  4     Pain Location  Shoulder    Pain Orientation  Left    Pain Descriptors / Indicators  Aching    Pain Type  Chronic pain    Pain Onset  More than a month ago    Pain Frequency  Constant                       [facility] Adult PT Treatment/Exercise - 11/26/19 0001      Shoulder Exercises: Supine   Other Supine Exercises  arm circles x 20  then hor abd/add x 15 both 5# weight then bench press plus boilateral x 20 5# bilateral      Shoulder Exercises: Standing   External Rotation  20 reps    Theraband Level (Shoulder External Rotation)  Level 4 (Blue)    Internal Rotation  20 reps    Theraband Level (Shoulder Internal Rotation)  Level 4 (Blue)    Flexion  20 reps    Shoulder Flexion Weight (lbs)  5    ABduction  20 reps    Shoulder ABduction Weight (lbs)  5    Extension  20 reps    Theraband Level (Shoulder Extension)  Level 4 (Blue)    Row  20 reps    Theraband Level (Shoulder Row)  Level 4 (Blue)      Shoulder Exercises: ROM/Strengthening   UBE (Upper Arm Bike)  L3 3 min for 3 back      Neuro reed with mirror for decr scapula  elevation       PT Education - 11/26/19 1415    Education Details  Use mirror  to observe elevation equality of shoulders and to work on not allowing Lt shoulder to elevate    Person(s) Educated  Patient    Methods  Explanation;Demonstration;Tactile cues;Verbal cues    Comprehension  Returned demonstration;Verbalized understanding       PT Short Term Goals - 11/12/19 1659      PT SHORT TERM GOAL #1   Title  Patient will report no pain with end range cervical extension    Time  3    Period  Weeks    Status  New    Target Date  12/03/19      PT SHORT TERM GOAL #2   Title  Patient will report no radicular pain into right arm  Time  3    Period  Weeks    Status  New    Target Date  12/03/19      PT SHORT TERM GOAL #3   Title  Patient will demonstrate 5/5 gross left shoulder strength    Time  3    Period  Weeks    Status  New    Target Date  12/03/19        PT Long Term Goals - 11/12/19 1705      PT LONG TERM GOAL #1   Title  Patient will keep arm out of sling for 1 day with no pain    Time  6    Period  Weeks    Status  New    Target Date  12/24/19      PT LONG TERM GOAL #2   Title  Patient will use arm for all adl's without increased pain    Time  6    Period  Weeks    Status  New    Target Date  12/24/19      PT LONG TERM GOAL #3   Title  Patient will perfrom work tasks without pain    Time  6    Period  Weeks    Status  New    Target Date  12/24/19            Plan - 11/26/19 1340    Clinical Impression Statement  Post exercise he repoerted he did not have incr shoulder pin but  reports he has begun toi  have back pain. This appears to be related to arching of back to compensate for shoulder weakness.  Suggested some stretching of back and use of heat and to stretch prior/during and end of stretching routinel.   Asked him to add 3 sessions    PT  Treatment/Interventions  ADLs/Self Care Home Management;Electrical Stimulation;Cryotherapy;Iontophoresis 12m/ml Dexamethasone;Ultrasound;DME Instruction;Functional mobility training;Therapeutic exercise;Neuromuscular re-education;Patient/family education;Manual techniques;Passive range of motion;Dry needling;Taping    PT Next Visit Plan  Assess response to HEP and progress PRN, progress periscapular and rotator cuff strength/endurance, consider including rhythmic stabilization as patient exhibits hypermobile tendancies    PT Home Exercise Plan  [facility] row, extension, ER/IR with blue band; scaption with 3-5 lbs; doorway pec stretch, upper trap stretch, levator scap stretch , use of  miror to monitor shoulder elevation with flexion and abduction.    Consulted and Agree with Plan of Care  Patient       Patient will benefit from skilled therapeutic intervention in order to improve the following deficits and impairments:  Impaired UE functional use, Postural dysfunction, Pain, Decreased strength, Decreased mobility, Decreased activity tolerance, Decreased endurance, Increased muscle spasms  Visit Diagnosis: Other muscle spasm     Problem List There are no problems to display for this patient.   CDarrel Hurst  PT 11/26/2019, 2:29 PM  [facility] [place] [address]Phone: [phone]  Fax:  [phone] Name: Richard KersteinMRN: [id number] of Birth: [birth year]               PHYSICAL THERAPY DISCHARGE SUMMARY  Visits from Start of Care: 3  Current functional level related to goals / functional outcomes: See above. [place]  Remaining deficits: See above   Education / Equipment: HEP  Plan: Patient agrees to discharge.  Patient goals were not met.  Patient is being discharged due to the patient's request.  ?????   Richard Hurst PT   12/01/19

## 2019-12-07 ENCOUNTER — Ambulatory Visit: Payer: PRIVATE HEALTH INSURANCE

## 2019-12-17 ENCOUNTER — Ambulatory Visit: Payer: PRIVATE HEALTH INSURANCE | Admitting: Pediatrics

## 2020-07-20 ENCOUNTER — Encounter (HOSPITAL_COMMUNITY): Payer: Self-pay

## 2020-07-20 ENCOUNTER — Ambulatory Visit (INDEPENDENT_AMBULATORY_CARE_PROVIDER_SITE_OTHER): Payer: BC Managed Care – PPO

## 2020-07-20 ENCOUNTER — Ambulatory Visit (HOSPITAL_COMMUNITY)
Admission: EM | Admit: 2020-07-20 | Discharge: 2020-07-20 | Disposition: A | Discharge status: Home / Self Care | Payer: BC Managed Care – PPO | Attending: Emergency Medicine | Admitting: Emergency Medicine

## 2020-07-20 ENCOUNTER — Other Ambulatory Visit: Payer: Self-pay

## 2020-07-20 DIAGNOSIS — R1032 Left lower quadrant pain: Secondary | ICD-10-CM

## 2020-07-20 DIAGNOSIS — R1012 Left upper quadrant pain: Secondary | ICD-10-CM | POA: Diagnosis present

## 2020-07-20 LAB — CBC WITH DIFFERENTIAL/PLATELET
Abs Immature Granulocytes: 0.01 10*3/uL (ref 0.00–0.07)
Basophils Absolute: 0 10*3/uL (ref 0.0–0.1)
Basophils Relative: 1 %
Eosinophils Absolute: 0.3 10*3/uL (ref 0.0–0.5)
Eosinophils Relative: 7 %
HCT: 48.5 % (ref 39.0–52.0)
Hemoglobin: 15.6 g/dL (ref 13.0–17.0)
Immature Granulocytes: 0 %
Lymphocytes Relative: 47 %
Lymphs Abs: 1.7 10*3/uL (ref 0.7–4.0)
MCH: 28.8 pg (ref 26.0–34.0)
MCHC: 32.2 g/dL (ref 30.0–36.0)
MCV: 89.5 fL (ref 80.0–100.0)
Monocytes Absolute: 0.4 10*3/uL (ref 0.1–1.0)
Monocytes Relative: 10 %
Neutro Abs: 1.3 10*3/uL — ABNORMAL LOW (ref 1.7–7.7)
Neutrophils Relative %: 35 %
Platelets: 228 10*3/uL (ref 150–400)
RBC: 5.42 MIL/uL (ref 4.22–5.81)
RDW: 12.8 % (ref 11.5–15.5)
WBC: 3.6 10*3/uL — ABNORMAL LOW (ref 4.0–10.5)
nRBC: 0 % (ref 0.0–0.2)

## 2020-07-20 LAB — COMPREHENSIVE METABOLIC PANEL
ALT: 45 U/L — ABNORMAL HIGH (ref 0–44)
AST: 48 U/L — ABNORMAL HIGH (ref 15–41)
Albumin: 3.7 g/dL (ref 3.5–5.0)
Alkaline Phosphatase: 48 U/L (ref 38–126)
Anion gap: 9 (ref 5–15)
BUN: 10 mg/dL (ref 6–20)
CO2: 27 mmol/L (ref 22–32)
Calcium: 9.2 mg/dL (ref 8.9–10.3)
Chloride: 102 mmol/L (ref 98–111)
Creatinine, Ser: 0.82 mg/dL (ref 0.61–1.24)
GFR, Estimated: >60 mL/min (ref >60)
Glucose, Bld: 80 mg/dL (ref 70–99)
Potassium: 3.9 mmol/L (ref 3.5–5.1)
Sodium: 138 mmol/L (ref 135–145)
Total Bilirubin: 0.7 mg/dL (ref 0.3–1.2)
Total Protein: 6.6 g/dL (ref 6.5–8.1)

## 2020-07-20 LAB — LIPASE, BLOOD: Lipase: 28 U/L (ref 11–51)

## 2020-07-20 MED ORDER — NAPROXEN 250 MG PO TABS: 500.0000 mg | ORAL_TABLET | Freq: Two times a day (BID) | ORAL | 0 refills | Status: AC

## 2020-07-20 NOTE — ED Triage Notes (Signed)
Pt c/o pain on left side of chest that started last week. Pt states it radiates to the right side sometimes. Pt states standing still relieves the pain.

## 2020-07-20 NOTE — ED Provider Notes (Signed)
[facility]    CSN: [id number] Arrival date & time: 07/20/20  1007      History   Chief Complaint Chief Complaint  Patient presents with   Chest Pain    HPI Richard Hurst is a 29 y.o. male.   Richard Hurst presents with complaints of  LUQ pain which started two days ago. No known injury. Certain positions worsen the past. No shortness of breath . No pain with breathing. No fevers. Denies any alcohol intake. Eating doesn't worsen pain. No nausea or vomiting. He does play soccer and do some physical work but no specific known injury. Hasn't taken any medications for symptoms. Denies any previous similar.     ROS per HPI, negative if not otherwise mentioned.      History reviewed. No pertinent past medical history.  There are no problems to display for this patient.   History reviewed. No pertinent surgical history.     Home Medications    Prior to Admission medications   Medication Sig Start Date End Date Taking? Authorizing Provider  cyclobenzaprine (FLEXERIL) 10 MG tablet Take 1 tablet (10 mg total) by mouth 2 (two) times daily as needed for muscle spasms. 11/05/19   Marca Ancona, MD  Lidocaine-Hydrocortisone Ace 1-1 % CREA Apply 1 application topically 2 (two) times daily. 07/01/19   Domenick Gong, MD  naproxen (NAPROSYN) 250 MG tablet Take 2 tablets (500 mg total) by mouth 2 (two) times daily with a meal. As needed for pain 07/20/20   Linus Mako B, NP  predniSONE (STERAPRED UNI-PAK 21 TAB) 10 MG (21) TBPK tablet Take by mouth daily. 11/05/19   Marca Ancona, MD    Family History Family History  Family history unknown: Yes    Social History Social History   Tobacco Use   Smoking status: Never Smoker   Smokeless tobacco: Never Used     Allergies   Patient has no allergy information on record.   Review of Systems Review of Systems   Physical Exam Triage Vital Signs ED Triage Vitals  Enc Vitals Group     BP 07/20/20  1133 115/64     Pulse Rate 07/20/20 1133 78     Resp 07/20/20 1133 18     Temp 07/20/20 1133 97.7 F (36.5 C)     Temp Source 07/20/20 1133 Oral     SpO2 07/20/20 1133 100 %     Weight --      Height --      Head Circumference --      Peak Flow --      Pain Score 07/20/20 1131 6     Pain Loc --      Pain Edu? --      Excl. in GC? --    No data found.  Updated Vital Signs BP 115/64 (BP Location: Right Arm)    Pulse 78    Temp 97.7 F (36.5 C) (Oral)    Resp 18    SpO2 100%   Visual Acuity Right Eye Distance:   Left Eye Distance:   Bilateral Distance:    Right Eye Near:   Left Eye Near:    Bilateral Near:     Physical Exam Constitutional:      Appearance: He is well-developed.  Cardiovascular:     Rate and Rhythm: Normal rate.  Pulmonary:     Effort: Pulmonary effort is normal.     Breath sounds: Normal breath sounds.  Abdominal:  Tenderness: There is abdominal tenderness in the left upper quadrant.  Skin:    General: Skin is warm and dry.  Neurological:     Mental Status: He is alert and oriented to person, place, and time.      UC Treatments / Results  Labs (all labs ordered are listed, but only abnormal results are displayed) Labs Reviewed  CBC WITH DIFFERENTIAL/PLATELET  COMPREHENSIVE METABOLIC PANEL  LIPASE, BLOOD    EKG   Radiology DG Abd Acute W/Chest  Result Date: 07/20/2020 CLINICAL DATA:  Left quadrant pain for 1 week. EXAM: DG ABDOMEN ACUTE WITH 1 VIEW CHEST COMPARISON:  None. FINDINGS: There is no evidence of dilated bowel loops or free intraperitoneal air. No radiopaque calculi or other significant radiographic abnormality is seen. Heart size and mediastinal contours are within normal limits. Both lungs are clear. IMPRESSION: Negative abdominal radiographs.  No active cardiopulmonary disease. Electronically Signed   By: Danae Orleans M.D.   On: 07/20/2020 13:03    Procedures Procedures (including critical care time)  Medications  Ordered in UC Medications - No data to display  Initial Impression / Assessment and Plan / UC Course  I have reviewed the triage vital signs and the nursing notes.  Pertinent labs & imaging results that were available during my care of the patient were reviewed by me and considered in my medical decision making (see chart for details).     LUQ pain on exam. Abd/chest without acute findings today. Vitals stable. Positions and palpation worsen symptoms, suspicious for msk etiology. Labs and lipase also collected and pending with concern for pancreatitis. No obvious source of this however and pain does not seem proportionate to pancreatitis necessarily. Return precautions provided. Patient verbalized understanding and agreeable to plan.  Ambulatory out of clinic without difficulty.   Final Clinical Impressions(s) / UC Diagnoses   Final diagnoses:  LUQ abdominal pain     Discharge Instructions     I feel this is likely muscular in nature.  Your xray is normal today.  I am waiting for your blood tests to result still, I will call you if I have any concern with your results.  Naproxen twice a day, take with food, as needed for pain.  Activity as tolerated.  Low fat diet as this could still be your pancreas.  If worsening of pain, fevers or nausea or vomiting I recommend going to the ER for further evaluation.   Je pense que c'est probablement de nature musculaire. Votre radiographie est normale aujourd'hui. J'attends toujours le rsultat de vos tests sanguins, je vous appellerai si j'ai des inquitudes avec vos rsultats. Naproxen deux fois par jour,  prendre avec de la nourriture, au besoin en cas de douleur. Activit telle que tolre. Rgime pauvre en graisses car cela pourrait toujours tre votre pancras. En cas d'aggravation de la douleur, de fivre, de nauses ou de vomissements, je recommande [employer] [facility] [employer] plus approfondie.    ED Prescriptions     Medication Sig Dispense Auth. Provider   naproxen (NAPROSYN) 250 MG tablet Take 2 tablets (500 mg total) by mouth 2 (two) times daily with a meal. As needed for pain 60 tablet Georgetta Haber, NP     PDMP not reviewed this encounter.   Georgetta Haber, NP 07/20/20 1324

## 2020-07-20 NOTE — Discharge Instructions (Addendum)
I feel this is likely muscular in nature.  Your xray is normal today.  I am waiting for your blood tests to result still, I will call you if I have any concern with your results.  Naproxen twice a day, take with food, as needed for pain.  Activity as tolerated.  Low fat diet as this could still be your pancreas.  If worsening of pain, fevers or nausea or vomiting I recommend going to the ER for further evaluation.   Je pense que c'est probablement de nature musculaire. Votre radiographie est normale aujourd'hui. J'attends toujours le rsultat de vos tests sanguins, je vous appellerai si j'ai des inquitudes avec vos rsultats. Naproxen deux fois par jour,  prendre avec de la nourriture, au besoin en cas de douleur. Activit telle que tolre. Rgime pauvre en graisses car cela pourrait toujours tre votre pancras. En cas d'aggravation de la douleur, de fivre, de nauses ou de vomissements, je recommande [employer] [facility] [employer] plus approfondie.

## 2022-01-20 IMAGING — DX DG SHOULDER 2+V*L*
4 series · 4 of 4 positions shown · non-contrast
Comparison: None.

CLINICAL DATA: Left shoulder for 2 weeks, no known injury, initial
encounter

EXAM:
LEFT SHOULDER - 2+ VIEW

[shoulder ap]
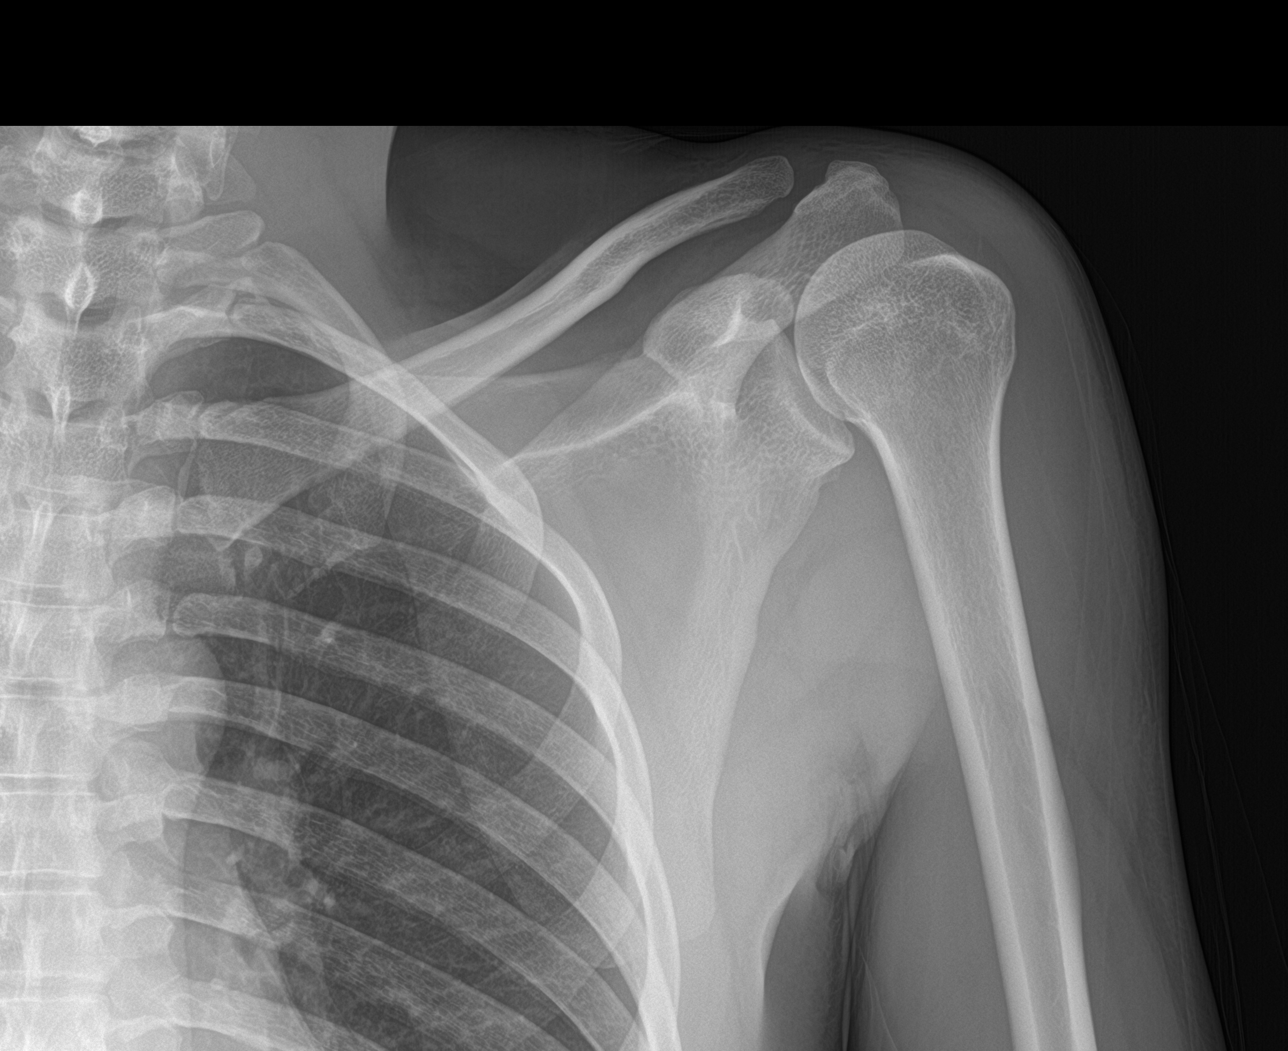

[shoulder grashey]
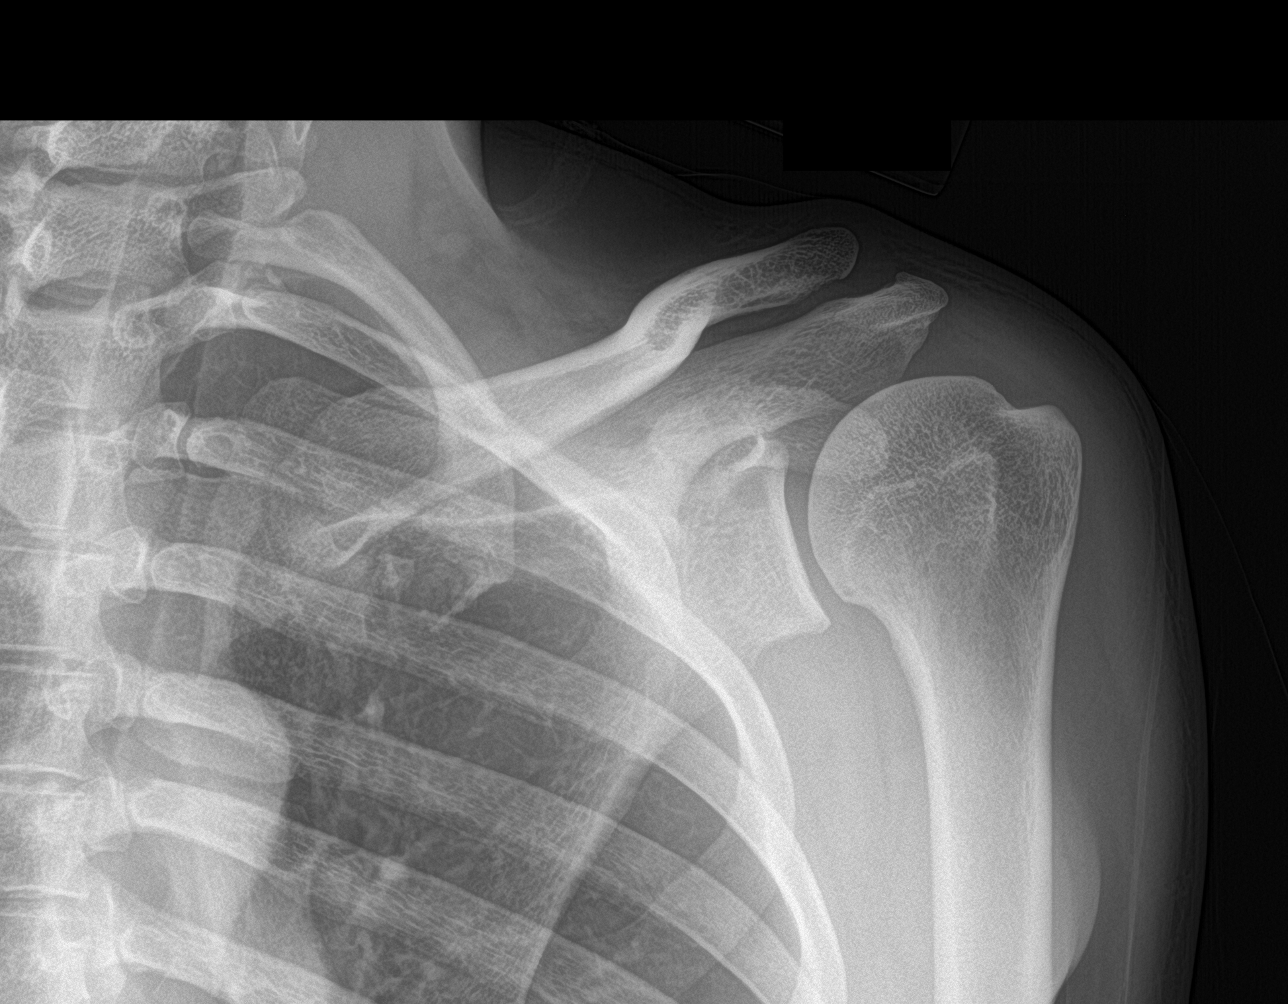

[shoulder y-view]
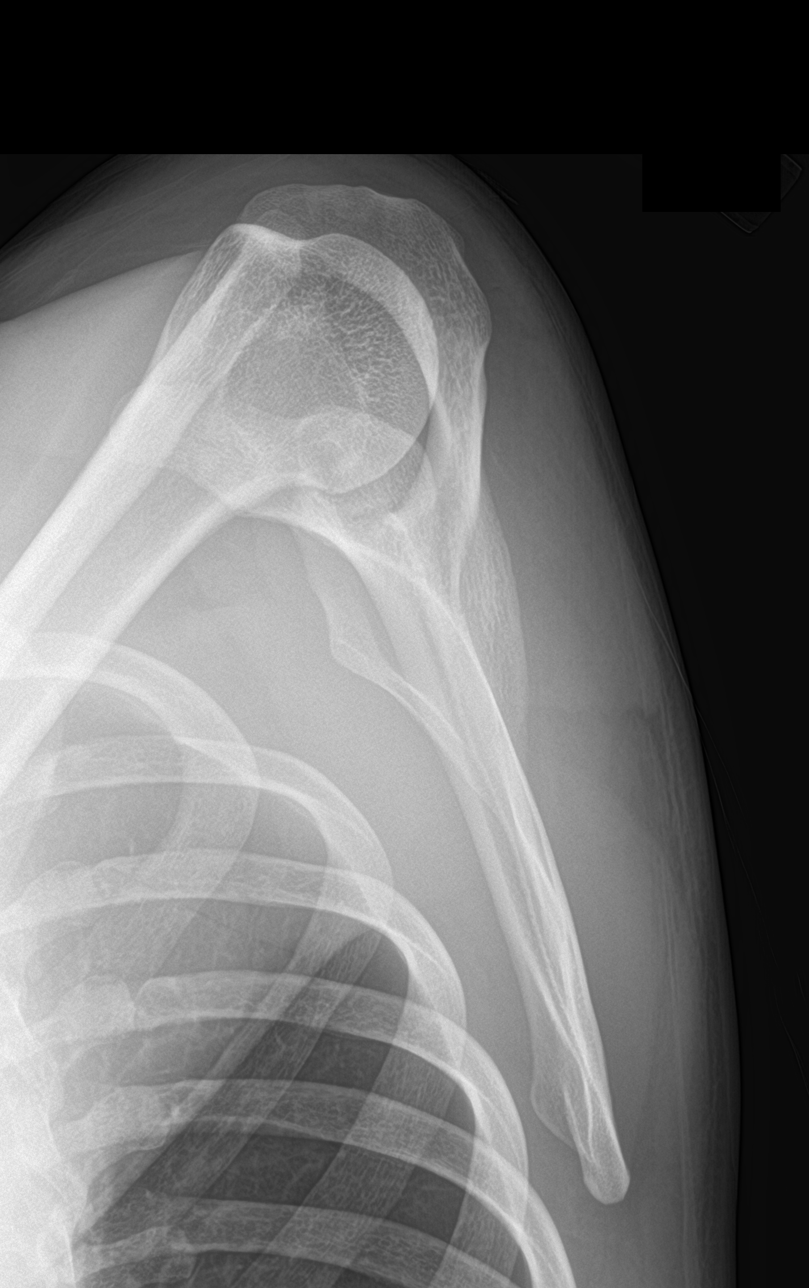

[shoulder axial]
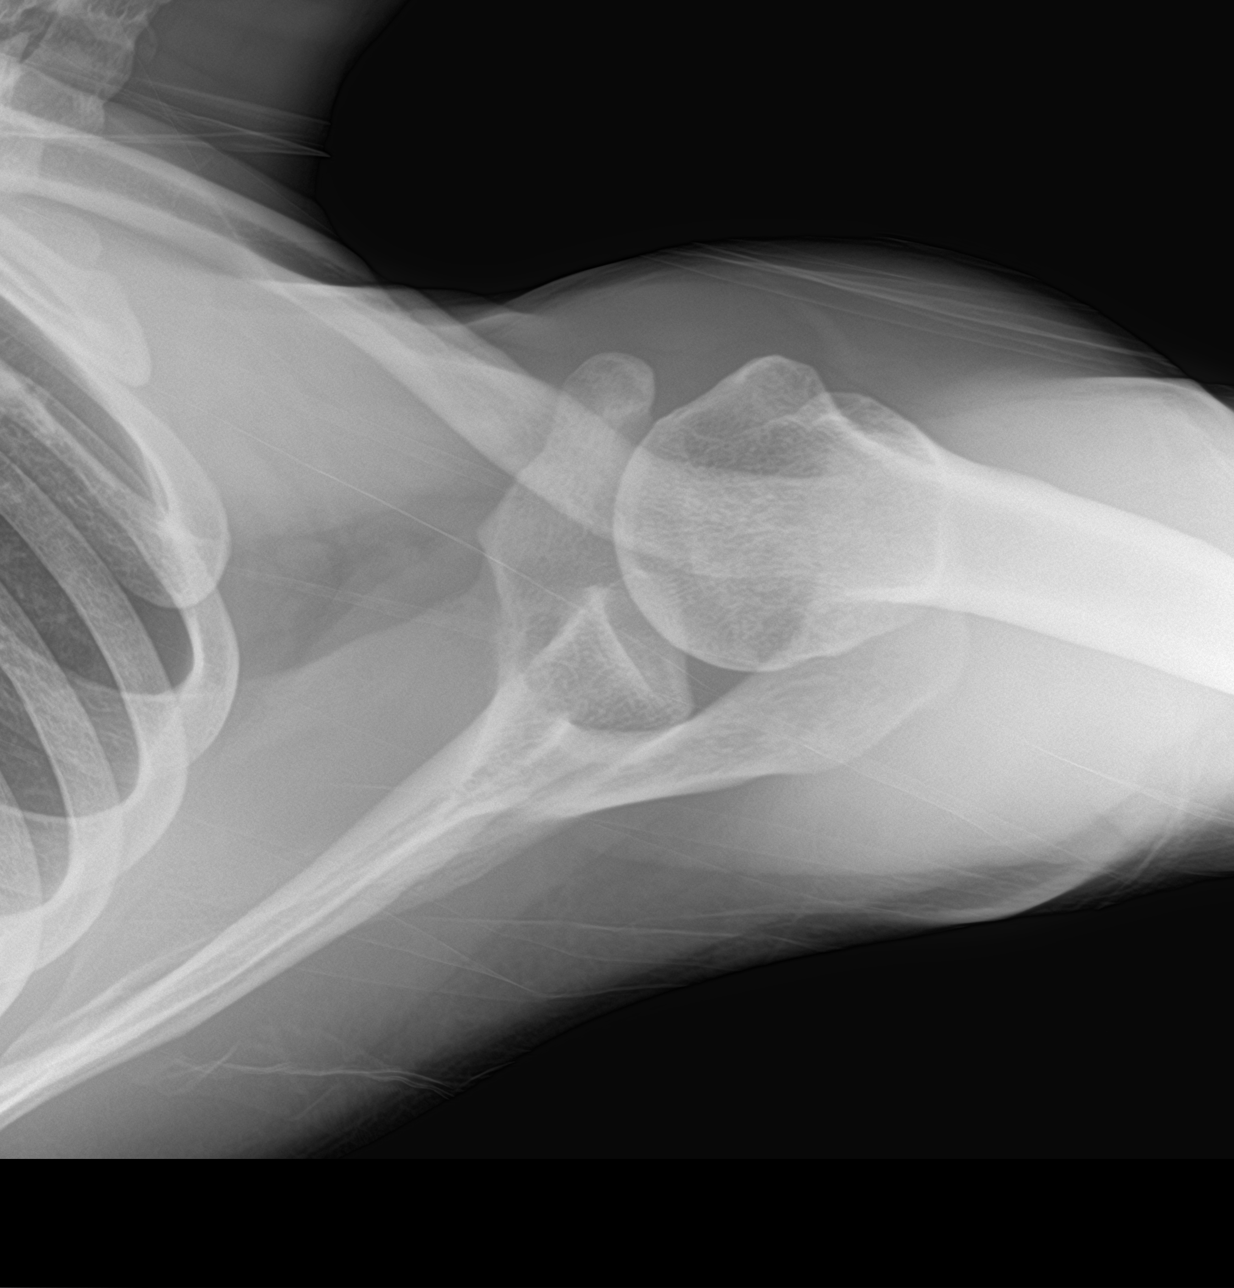

[4 of 4 positions shown; findings below may reference images not displayed]

FINDINGS: There is no evidence of fracture or dislocation. There is no
evidence of arthropathy or other focal bone abnormality. Soft
tissues are unremarkable.
IMPRESSION: No acute abnormality noted.

## 2024-01-24 ENCOUNTER — Ambulatory Visit (HOSPITAL_COMMUNITY): Admission: EM | Admit: 2024-01-24 | Discharge: 2024-01-24 | Disposition: A | Discharge status: Home / Self Care

## 2024-01-24 ENCOUNTER — Encounter (HOSPITAL_COMMUNITY): Payer: Self-pay

## 2024-01-24 ENCOUNTER — Other Ambulatory Visit: Payer: Self-pay

## 2024-01-24 DIAGNOSIS — K644 Residual hemorrhoidal skin tags: Secondary | ICD-10-CM

## 2024-01-24 MED ORDER — LIDOCAINE-HYDROCORTISONE ACE 1-1 % EX CREA
1.0000 | TOPICAL_CREAM | Freq: Two times a day (BID) | CUTANEOUS | 0 refills | Status: AC
  Filled 2024-01-24: qty 30, fill #0

## 2024-01-24 NOTE — ED Triage Notes (Signed)
 Patient presents to the office for bump near anus area. Patient states this is recurrent issue for him.

## 2024-01-24 NOTE — Discharge Instructions (Addendum)
 We have sent in a topical cream for you.  Additionally, we recommend doing sitz bath's, taking MiraLAX/stool softener, and taking Tylenol ibuprofen  as needed for pain relief.  We have provided you with information to follow-up with a general surgeon if your symptoms are not improving over the next 1 to 2 weeks.  Please call them at that time to make an appointment if your symptoms have not improved.  Please return or go to the emergency department if you develop severe pain, fever, worsening symptoms, or if you have any other concerns.

## 2024-01-24 NOTE — ED Provider Notes (Signed)
 [facility]    CSN: [id number] Arrival date & time: 01/24/24  0818      History   Chief Complaint No chief complaint on file.   HPI Richard Hurst is a 33 y.o. male.   Patient is a 33 year old male who presents to the urgent care today with concerns of possible external hemorrhoid.  He reports having issues with this in the past.  He reports that this most recent flare began about 2 weeks ago.  He has not tried anything or done anything for his symptoms.  He reports noticing some blood occasionally when he wipes.  He denies any fever, pus drainage, pain out of proportion, abdominal pain, or other concerns at this time.    History reviewed. No pertinent past medical history.  There are no active problems to display for this patient.   History reviewed. No pertinent surgical history.     Home Medications    Prior to Admission medications   Medication Sig Start Date End Date Taking? Authorizing Provider  Lidocaine -Hydrocortisone  Ace 1-1 % CREA Apply 1 Application topically in the morning and at bedtime. 01/24/24  Yes Melonie Locus, PA-C  cyclobenzaprine  (FLEXERIL ) 10 MG tablet Take 1 tablet (10 mg total) by mouth 2 (two) times daily as needed for muscle spasms. 11/05/19   Vernona Aleck SAILOR, MD  naproxen  (NAPROSYN ) 250 MG tablet Take 2 tablets (500 mg total) by mouth 2 (two) times daily with a meal. As needed for pain 07/20/20   Burky, Natalie B, NP  predniSONE  (STERAPRED UNI-PAK 21 TAB) 10 MG (21) TBPK tablet Take by mouth daily. 11/05/19   Vernona Aleck SAILOR, MD    Family History Family History  Family history unknown: Yes    Social History Social History   Tobacco Use   Smoking status: Never   Smokeless tobacco: Never     Allergies   Patient has no known allergies.   Review of Systems Review of Systems See HPI for relevant ROS.  Physical Exam Triage Vital Signs ED Triage Vitals  Encounter Vitals Group     BP 01/24/24 0857 107/70     Girls  Systolic BP Percentile --      Girls Diastolic BP Percentile --      Boys Systolic BP Percentile --      Boys Diastolic BP Percentile --      Pulse Rate 01/24/24 0857 73     Resp 01/24/24 0857 (!) 1     Temp 01/24/24 0857 98.4 F (36.9 C)     Temp Source 01/24/24 0857 Oral     SpO2 01/24/24 0857 98 %     Weight --      Height --      Head Circumference --      Peak Flow --      Pain Score 01/24/24 0858 4     Pain Loc --      Pain Education --      Exclude from Growth Chart --    No data found.  Updated Vital Signs BP 107/70 (BP Location: Left Arm)   Pulse 73   Temp 98.4 F (36.9 C) (Oral)   Resp (!) 1   SpO2 98%   Visual Acuity Right Eye Distance:   Left Eye Distance:   Bilateral Distance:    Right Eye Near:   Left Eye Near:    Bilateral Near:     Physical Exam General: Alert and oriented, well-developed/well-nourished, calm, cooperative, no acute distress HEENT:  Normocephalic atraumatic, moist mucous membranes, no scleral icterus, trachea midline Lungs: Speaking full sentences, non-labored respirations, no distress Heart: Regular rate and rhythm Abdomen:  Soft, nondistended Musculoskeletal: Moves all extremities well Rectal: External hemorrhoid without thrombosis Neurologic: Awake, A&O x4, gait normal Integumentary: Warm, dry, normal for ethnicity, intact, no rash Psychiatric: Appropriate mood & affect  UC Treatments / Results  Labs (all labs ordered are listed, but only abnormal results are displayed) Labs Reviewed - No data to display  EKG   Radiology No results found.  Procedures Procedures (including critical care time)  Medications Ordered in UC Medications - No data to display  Initial Impression / Assessment and Plan / UC Course  I have reviewed the triage vital signs and the nursing notes.  Pertinent labs & imaging results that were available during my care of the patient were reviewed by me and considered in my medical decision making  (see chart for details).   Presents with rectal pain.  Differential Diagnosis: External hemorrhoid, internal hemorrhoid, rectal abscess, Fournier's gangrene, including other diagnoses.  Rationale: Patient presents with rectal pain.  Upon examination, patient has an external hemorrhoid present.  The hemorrhoid is not thrombosed.  Patient is well-appearing, nontoxic, and in no acute distress.  Prescribed patient topical cream he can use for that area.  Discussed education on performing sitz bath's, using MiraLAX and keeping the stool as soft as possible, and other over-the-counter options patient can use for symptomatic improvement.  He can take Tylenol and ibuprofen  as needed for pain relief.  We provided him with information to follow-up with general surgery if his symptoms are not improving.  Discussed return precautions to the urgent care emergency department including worsening symptoms, fever, pain out of proportion, pus drainage, or if he has any other concerns.  Disposition: Stable to discharge home.  All questions answered to the best of this examiner's ability. Reviewed possible severe sequelae and other reasons to return to urgent care or ED for further evaluation and/or treatment. Advised to f/u PCP w/in 48 to 72 hours for further eval and/or reassessment. Patient voices understanding of the above and agrees to plan.  An appropriate evaluation has been performed, and in my medical judgment there is currently no evidence of an immediate life-threatening or surgical condition. Discharge is therefore indicated at this time.  This document was created using the aid of voice recognition Scientist, clinical (histocompatibility and immunogenetics).  Final Clinical Impressions(s) / UC Diagnoses   Final diagnoses:  External hemorrhoid     Discharge Instructions      We have sent in a topical cream for you.  Additionally, we recommend doing sitz bath's, taking MiraLAX/stool softener, and taking Tylenol ibuprofen  as needed  for pain relief.  We have provided you with information to follow-up with a general surgeon if your symptoms are not improving over the next 1 to 2 weeks.  Please call them at that time to make an appointment if your symptoms have not improved.  Please return or go to the emergency department if you develop severe pain, fever, worsening symptoms, or if you have any other concerns.    ED Prescriptions     Medication Sig Dispense Auth. Provider   Lidocaine -Hydrocortisone  Ace 1-1 % CREA Apply 1 Application topically in the morning and at bedtime. 30 g Melonie Locus, PA-C      PDMP not reviewed this encounter.   Melonie Locus, PA-C 01/24/24 1006
